# Patient Record
Sex: Female | Born: 1945 | Hispanic: No | Smoking: Never smoker
Health system: Southern US, Community
[De-identification: ages and names within clinical notes are randomized; demographics above are authoritative.]

## PROBLEM LIST (undated history)

## (undated) DIAGNOSIS — M1990 Unspecified osteoarthritis, unspecified site: Secondary | ICD-10-CM

## (undated) DIAGNOSIS — E785 Hyperlipidemia, unspecified: Secondary | ICD-10-CM

## (undated) DIAGNOSIS — M810 Age-related osteoporosis without current pathological fracture: Secondary | ICD-10-CM

## (undated) DIAGNOSIS — Z973 Presence of spectacles and contact lenses: Secondary | ICD-10-CM

## (undated) DIAGNOSIS — I10 Essential (primary) hypertension: Secondary | ICD-10-CM

## (undated) HISTORY — PX: COLONOSCOPY: SHX174

## (undated) HISTORY — PX: TUBAL LIGATION: SHX77

## (undated) HISTORY — PX: TONSILLECTOMY: SUR1361

---

## 2001-03-11 HISTORY — PX: SEPTOPLASTY: SUR1290

## 2005-12-20 ENCOUNTER — Other Ambulatory Visit: Admission: RE | Admit: 2005-12-20 | Discharge: 2005-12-20 | Payer: Self-pay | Admitting: Family Medicine

## 2012-05-29 ENCOUNTER — Encounter (HOSPITAL_BASED_OUTPATIENT_CLINIC_OR_DEPARTMENT_OTHER): Payer: Self-pay | Admitting: *Deleted

## 2012-05-29 ENCOUNTER — Other Ambulatory Visit: Payer: Self-pay | Admitting: Orthopedic Surgery

## 2012-05-29 NOTE — Progress Notes (Signed)
To come in for [id number]  

## 2012-06-01 LAB — BASIC METABOLIC PANEL
BUN: 18 mg/dL (ref 6–23)
Creatinine, Ser: 0.74 mg/dL (ref 0.50–1.10)
GFR calc Af Amer: >90 mL/min (ref >90)
GFR calc non Af Amer: 87 mL/min — ABNORMAL LOW (ref >90)
Glucose, Bld: 89 mg/dL (ref 70–99)

## 2012-06-02 NOTE — H&P (Signed)
Shelby Vega is an 67 y.o. female.   CC / Reason for Visit: Left wrist injury HPI: This patient is a 67 year old female who presents for evaluation of a left wrist injury that occurred when she tripped and fell at her home on the date above.  She was evaluated and ran out [facility] emergency department and placed into a sugar tong splint.  She presents today for further evaluation and management.  She is presently retired and does not work outside the home.  She is taking hydrocodone for pain and requires a refill.  Past Medical History  Diagnosis Date  . Osteoporosis   . Hypertension   . Hyperlipemia   . Wears glasses   . Arthritis     Past Surgical History  Procedure Laterality Date  . Tubal ligation    . Tonsillectomy    . Colonoscopy    . Septoplasty  2003    post fx nose    History reviewed. No pertinent family history. Social History:  reports that she has never smoked. She does not have any smokeless tobacco history on file. She reports that she does not drink alcohol or use illicit drugs.  Allergies:  Allergies  Allergen Reactions  . Codeine Nausea And Vomiting    No prescriptions prior to admission    Results for orders placed during the hospital encounter of 06/03/12 (from the past 48 hour(s))  BASIC METABOLIC PANEL     Status: Abnormal   Collection Time    06/01/12 12:30 PM      Result Value Range   Sodium 137  135 - 145 mEq/L   Potassium 4.2  3.5 - 5.1 mEq/L   Chloride 99  96 - 112 mEq/L   CO2 28  19 - 32 mEq/L   Glucose, Bld 89  70 - 99 mg/dL   BUN 18  6 - 23 mg/dL   Creatinine, Ser 1.61  0.50 - 1.10 mg/dL   Calcium 09.6  8.4 - 04.5 mg/dL   GFR calc non Af Amer 87 (*) >90 mL/min   GFR calc Af Amer >90  >90 mL/min   Comment:            The eGFR has been calculated     using the CKD EPI equation.     This calculation has not been     validated in all clinical     situations.     eGFR's persistently     <90 mL/min signify     possible Chronic Kidney  Disease.   No results found.  Review of Systems  All other systems reviewed and are negative.    Height 5\' 4"  (1.626 m), weight 72.576 kg (160 lb). Physical Exam  Constitutional:  WD, WN, NAD HEENT:  NCAT, EOMI Neuro/Psych:  Alert & oriented to person, place, and time; appropriate mood & affect Lymphatic: No generalized UE edema or lymphadenopathy Extremities / MSK:  Both UE are normal with respect to appearance, ranges of motion, joint stability, muscle strength/tone, sensation, & perfusion except as otherwise noted:  Left wrist has a nice fitting sugar tong splint applied.  It is slightly long in the palm and subsequently we trimmed it.  Intact light touch sensation in the radial, ulnar, and median nerve distributions with intact motor to the same.  Labs / Xrays:  4 views of the left wrist ordered and obtained today reveal an extra articular distal radius fracture with 22 dorsal tilt and a small slightly displaced fracture of  the tip of the ulnar styloid  Assessment: Displaced dorsally tilted left distal radius fracture  Plan:  I discussed these findings with the patient and used a plastic models.  I recommended open treatment to restore a more anatomic alignment and optimize functional recovery.   The details of the operative procedure were discussed with the patient.  Questions were invited and answered.  In addition to the goal of the procedure, the risks of the procedure to include but not limited to bleeding; infection; damage to the nerves or blood vessels that could result in bleeding, numbness, weakness, chronic pain, and the need for additional procedures; stiffness; the need for revision surgery; and anesthetic risks, the worst of which is death, were reviewed.  No specific outcome was guaranteed or implied.  Informed consent was obtained.  Prescriptions for postoperative analgesia were also written.  A return appointment 10-15 days postop was made.   Shelby Vega  A. 06/02/2012, 10:46 AM

## 2012-06-03 ENCOUNTER — Encounter (HOSPITAL_BASED_OUTPATIENT_CLINIC_OR_DEPARTMENT_OTHER)
Admission: RE | Disposition: A | Discharge status: Home / Self Care | Payer: Self-pay | Source: Ambulatory Visit | Attending: Orthopedic Surgery

## 2012-06-03 ENCOUNTER — Ambulatory Visit (HOSPITAL_BASED_OUTPATIENT_CLINIC_OR_DEPARTMENT_OTHER): Payer: Medicare Other | Admitting: Certified Registered Nurse Anesthetist

## 2012-06-03 ENCOUNTER — Ambulatory Visit (HOSPITAL_BASED_OUTPATIENT_CLINIC_OR_DEPARTMENT_OTHER)
Admission: RE | Admit: 2012-06-03 | Discharge: 2012-06-03 | Disposition: A | Discharge status: Home / Self Care | Payer: Medicare Other | Source: Ambulatory Visit | Attending: Orthopedic Surgery | Admitting: Orthopedic Surgery

## 2012-06-03 ENCOUNTER — Encounter (HOSPITAL_BASED_OUTPATIENT_CLINIC_OR_DEPARTMENT_OTHER): Payer: Self-pay

## 2012-06-03 ENCOUNTER — Ambulatory Visit (HOSPITAL_COMMUNITY): Payer: Medicare Other

## 2012-06-03 ENCOUNTER — Encounter (HOSPITAL_BASED_OUTPATIENT_CLINIC_OR_DEPARTMENT_OTHER): Payer: Self-pay | Admitting: Certified Registered Nurse Anesthetist

## 2012-06-03 DIAGNOSIS — Z885 Allergy status to narcotic agent status: Secondary | ICD-10-CM | POA: Insufficient documentation

## 2012-06-03 DIAGNOSIS — Y92009 Unspecified place in unspecified non-institutional (private) residence as the place of occurrence of the external cause: Secondary | ICD-10-CM | POA: Insufficient documentation

## 2012-06-03 DIAGNOSIS — S52509A Unspecified fracture of the lower end of unspecified radius, initial encounter for closed fracture: Secondary | ICD-10-CM | POA: Insufficient documentation

## 2012-06-03 DIAGNOSIS — S52609A Unspecified fracture of lower end of unspecified ulna, initial encounter for closed fracture: Secondary | ICD-10-CM | POA: Insufficient documentation

## 2012-06-03 DIAGNOSIS — I10 Essential (primary) hypertension: Secondary | ICD-10-CM | POA: Insufficient documentation

## 2012-06-03 DIAGNOSIS — M810 Age-related osteoporosis without current pathological fracture: Secondary | ICD-10-CM | POA: Insufficient documentation

## 2012-06-03 DIAGNOSIS — M129 Arthropathy, unspecified: Secondary | ICD-10-CM | POA: Insufficient documentation

## 2012-06-03 DIAGNOSIS — E785 Hyperlipidemia, unspecified: Secondary | ICD-10-CM | POA: Insufficient documentation

## 2012-06-03 DIAGNOSIS — W010XXA Fall on same level from slipping, tripping and stumbling without subsequent striking against object, initial encounter: Secondary | ICD-10-CM | POA: Insufficient documentation

## 2012-06-03 HISTORY — DX: Age-related osteoporosis without current pathological fracture: M81.0

## 2012-06-03 HISTORY — DX: Essential (primary) hypertension: I10

## 2012-06-03 HISTORY — PX: OPEN REDUCTION INTERNAL FIXATION (ORIF) DISTAL RADIAL FRACTURE: SHX5989

## 2012-06-03 HISTORY — DX: Presence of spectacles and contact lenses: Z97.3

## 2012-06-03 HISTORY — DX: Hyperlipidemia, unspecified: E78.5

## 2012-06-03 HISTORY — DX: Unspecified osteoarthritis, unspecified site: M19.90

## 2012-06-03 LAB — POCT HEMOGLOBIN-HEMACUE: Hemoglobin: 15.7 g/dL — ABNORMAL HIGH (ref 12.0–15.0)

## 2012-06-03 SURGERY — OPEN REDUCTION INTERNAL FIXATION (ORIF) DISTAL RADIUS FRACTURE
Anesthesia: Regional | Site: Wrist | Laterality: Left | Wound class: Clean

## 2012-06-03 MED ORDER — LIDOCAINE HCL (CARDIAC) 20 MG/ML IV SOLN
INTRAVENOUS | Status: DC | PRN
  Administered 2012-06-03: 30 mg via INTRAVENOUS

## 2012-06-03 MED ORDER — CEFAZOLIN SODIUM-DEXTROSE 2-3 GM-% IV SOLR
2.0000 g | INTRAVENOUS | Status: AC
  Administered 2012-06-03: 2 g via INTRAVENOUS

## 2012-06-03 MED ORDER — DEXAMETHASONE SODIUM PHOSPHATE 10 MG/ML IJ SOLN
INTRAMUSCULAR | Status: DC | PRN
  Administered 2012-06-03: 10 mg via INTRAVENOUS

## 2012-06-03 MED ORDER — 0.9 % SODIUM CHLORIDE (POUR BTL) OPTIME
TOPICAL | Status: DC | PRN
  Administered 2012-06-03: 1000 mL

## 2012-06-03 MED ORDER — ONDANSETRON HCL 4 MG/2ML IJ SOLN
INTRAMUSCULAR | Status: DC | PRN
  Administered 2012-06-03: 4 mg via INTRAVENOUS

## 2012-06-03 MED ORDER — MIDAZOLAM HCL 2 MG/2ML IJ SOLN
1.0000 mg | INTRAMUSCULAR | Status: DC | PRN
  Administered 2012-06-03 (×2): 1 mg via INTRAVENOUS

## 2012-06-03 MED ORDER — LIDOCAINE HCL 1 % IJ SOLN
INTRAMUSCULAR | Status: DC | PRN
  Administered 2012-06-03: 2 mL via INTRADERMAL

## 2012-06-03 MED ORDER — MIDAZOLAM HCL 5 MG/5ML IJ SOLN
INTRAMUSCULAR | Status: DC | PRN
  Administered 2012-06-03: 1 mg via INTRAVENOUS

## 2012-06-03 MED ORDER — FENTANYL CITRATE 0.05 MG/ML IJ SOLN
50.0000 ug | INTRAMUSCULAR | Status: DC | PRN
  Administered 2012-06-03 (×2): 50 ug via INTRAVENOUS

## 2012-06-03 MED ORDER — PROPOFOL 10 MG/ML IV BOLUS
INTRAVENOUS | Status: DC | PRN
  Administered 2012-06-03: 180 mg via INTRAVENOUS

## 2012-06-03 MED ORDER — LACTATED RINGERS IV SOLN: INTRAVENOUS | Status: DC

## 2012-06-03 MED ORDER — ROPIVACAINE HCL 5 MG/ML IJ SOLN
INTRAMUSCULAR | Status: DC | PRN
  Administered 2012-06-03: 20 mL

## 2012-06-03 MED ORDER — LACTATED RINGERS IV SOLN
INTRAVENOUS | Status: DC
  Administered 2012-06-03 (×2): via INTRAVENOUS

## 2012-06-03 SURGICAL SUPPLY — 58 items
BANDAGE COBAN STERILE 2 (GAUZE/BANDAGES/DRESSINGS) IMPLANT
BANDAGE GAUZE ELAST BULKY 4 IN (GAUZE/BANDAGES/DRESSINGS) ×4 IMPLANT
BIT DRILL 2 FAST STEP (BIT) ×1 IMPLANT
BIT DRILL 2.5X4 QC (BIT) ×1 IMPLANT
BLADE MINI RND TIP GREEN BEAV (BLADE) ×1 IMPLANT
BLADE SURG 15 STRL LF DISP TIS (BLADE) ×1 IMPLANT
BLADE SURG 15 STRL SS (BLADE) ×4
BNDG CMPR 9X4 STRL LF SNTH (GAUZE/BANDAGES/DRESSINGS) ×2
BNDG COHESIVE 4X5 TAN STRL (GAUZE/BANDAGES/DRESSINGS) ×2 IMPLANT
BNDG ESMARK 4X9 LF (GAUZE/BANDAGES/DRESSINGS) ×3 IMPLANT
BRUSH SCRUB EZ PLAIN DRY (MISCELLANEOUS) ×1 IMPLANT
CANISTER SUCTION 1200CC (MISCELLANEOUS) ×2 IMPLANT
CHLORAPREP W/TINT 26ML (MISCELLANEOUS) ×2 IMPLANT
CORDS BIPOLAR (ELECTRODE) ×2 IMPLANT
COVER MAYO STAND STRL (DRAPES) ×2 IMPLANT
COVER TABLE BACK 60X90 (DRAPES) ×2 IMPLANT
CUFF TOURNIQUET SINGLE 18IN (TOURNIQUET CUFF) ×1 IMPLANT
CUFF TOURNIQUET SINGLE 24IN (TOURNIQUET CUFF) IMPLANT
DRAPE C-ARM 42X72 X-RAY (DRAPES) ×2 IMPLANT
DRAPE EXTREMITY T 121X128X90 (DRAPE) ×2 IMPLANT
DRAPE SURG 17X23 STRL (DRAPES) ×2 IMPLANT
DRSG ADAPTIC 3X8 NADH LF (GAUZE/BANDAGES/DRESSINGS) ×2 IMPLANT
DRSG EMULSION OIL 3X3 NADH (GAUZE/BANDAGES/DRESSINGS) ×1 IMPLANT
GLOVE BIO SURGEON STRL SZ 6.5 (GLOVE) ×1 IMPLANT
GLOVE BIO SURGEON STRL SZ7.5 (GLOVE) ×2 IMPLANT
GLOVE BIOGEL PI IND STRL 8 (GLOVE) ×1 IMPLANT
GLOVE BIOGEL PI INDICATOR 8 (GLOVE) ×1
GLOVE ECLIPSE 6.5 STRL STRAW (GLOVE) ×1 IMPLANT
GOWN PREVENTION PLUS XLARGE (GOWN DISPOSABLE) ×3 IMPLANT
NDL HYPO 25X1 1.5 SAFETY (NEEDLE) IMPLANT
NEEDLE HYPO 25X1 1.5 SAFETY (NEEDLE) IMPLANT
NS IRRIG 1000ML POUR BTL (IV SOLUTION) ×2 IMPLANT
PACK BASIN DAY SURGERY FS (CUSTOM PROCEDURE TRAY) ×2 IMPLANT
PAD CAST 4YDX4 CTTN HI CHSV (CAST SUPPLIES) ×1 IMPLANT
PADDING CAST ABS 4INX4YD NS (CAST SUPPLIES) ×1
PADDING CAST ABS COTTON 4X4 ST (CAST SUPPLIES) IMPLANT
PADDING CAST COTTON 4X4 STRL (CAST SUPPLIES) ×2
PEG SUBCHONDRAL SMOOTH 2.0X18 (Peg) ×2 IMPLANT
PEG SUBCHONDRAL SMOOTH 2.0X20 (Peg) ×4 IMPLANT
PENCIL BUTTON HOLSTER BLD 10FT (ELECTRODE) ×2 IMPLANT
PLATE SHORT 21.6X48.9 NRRW LT (Plate) ×1 IMPLANT
RUBBERBAND STERILE (MISCELLANEOUS) IMPLANT
SCREW BN 12X3.5XNS CORT TI (Screw) IMPLANT
SCREW CORT 3.5X10 LNG (Screw) ×1 IMPLANT
SCREW CORT 3.5X12 (Screw) ×4 IMPLANT
SLEEVE SCD COMPRESS KNEE MED (MISCELLANEOUS) ×1 IMPLANT
SPLINT PLASTER CAST XFAST 3X15 (CAST SUPPLIES) IMPLANT
SPLINT PLASTER XTRA FASTSET 3X (CAST SUPPLIES) ×10
SPONGE GAUZE 4X4 12PLY (GAUZE/BANDAGES/DRESSINGS) ×1 IMPLANT
STOCKINETTE 4X48 STRL (DRAPES) ×2 IMPLANT
SUT VIC AB 2-0 CT3 27 (SUTURE) ×3 IMPLANT
SUT VICRYL 4-0 PS2 18IN ABS (SUTURE) IMPLANT
SUT VICRYL RAPIDE 4/0 PS 2 (SUTURE) ×2 IMPLANT
SYR BULB 3OZ (MISCELLANEOUS) ×2 IMPLANT
SYRINGE 10CC LL (SYRINGE) IMPLANT
TOWEL OR 17X24 6PK STRL BLUE (TOWEL DISPOSABLE) ×2 IMPLANT
TOWEL OR NON WOVEN STRL DISP B (DISPOSABLE) ×1 IMPLANT
UNDERPAD 30X30 INCONTINENT (UNDERPADS AND DIAPERS) ×2 IMPLANT

## 2012-06-03 NOTE — Interval H&P Note (Signed)
History and Physical Interval Note:  06/03/2012 11:27 AM  Shelby Vega  has presented today for surgery, with the diagnosis of let distal radius fracture   The various methods of treatment have been discussed with the patient and family. After consideration of risks, benefits and other options for treatment, the patient has consented to  Procedure(s): OPEN REDUCTION INTERNAL FIXATION (ORIF) LEFT DISTAL RADIUS (Left) as a surgical intervention .  The patient's history has been reviewed, patient examined, no change in status, stable for surgery.  I have reviewed the patient's chart and labs.  Questions were answered to the patient's satisfaction.     Zarinah Oviatt A.

## 2012-06-03 NOTE — Transfer of Care (Signed)
Immediate Anesthesia Transfer of Care Note  Patient: Shelby Vega  Procedure(s) Performed: Procedure(s): OPEN REDUCTION INTERNAL FIXATION (ORIF) LEFT DISTAL RADIUS (Left)  Patient Location: PACU  Anesthesia Type:GA combined with regional for post-op pain  Level of Consciousness: awake, alert , oriented and patient cooperative  Airway & Oxygen Therapy: Patient Spontanous Breathing and Patient connected to face mask oxygen  Post-op Assessment: Report given to PACU RN and Post -op Vital signs reviewed and stable  Post vital signs: Reviewed and stable  Complications: No apparent anesthesia complications

## 2012-06-03 NOTE — Progress Notes (Signed)
Assisted Dr. Frederick with left, ultrasound guided, supraclavicular block. Side rails up, monitors on throughout procedure. See vital signs in flow sheet. Tolerated Procedure well. 

## 2012-06-03 NOTE — Op Note (Signed)
06/03/2012  11:27 AM  PATIENT:  Shelby Vega  67 y.o. female  PRE-OPERATIVE DIAGNOSIS:  Displaced left distal radius fracture  POST-OPERATIVE DIAGNOSIS:  Same  PROCEDURE:  ORIF left distal radius fracture  SURGEON: Cliffton Asters. Janee Morn, MD  PHYSICIAN ASSISTANT: None  ANESTHESIA:  regional and general  SPECIMENS:  None  DRAINS:   None  PREOPERATIVE INDICATIONS:  [name] is a  67 y.o. female with a diagnosis of displaced left distal radius fracture with greater than 20 dorsal tilt who elected for surgical management.    The risks benefits and alternatives were discussed with the patient preoperatively including but not limited to the risks of infection, bleeding, nerve injury, cardiopulmonary complications, the need for revision surgery, among others, and the patient verbalized understanding and consented to proceed.  OPERATIVE IMPLANTS: Biomet DVR plate and screws  OPERATIVE FINDINGS: Near anatomic reduction following fixation  OPERATIVE PROCEDURE:  After receiving prophylactic antibiotics and a regional block, the patient was escorted to the operative theatre and placed in a supine position.  General anesthesia was administered. A surgical "time-out" was performed during which the planned procedure, proposed operative site, and the correct patient identity were compared to the operative consent and agreement confirmed by the circulating nurse according to current facility policy. The splint was removed, and the hand and arm were pre-scrubbed with a Hibiclens scrub brush.  Following application of a tourniquet to the operative extremity, the exposed skin was prepped with Chloraprep and draped in the usual sterile fashion.  The limb was exsanguinated with an Esmarch bandage and the tourniquet inflated to approximately higher than systolic BP.  A curvilinear incision over the FCR axis was made sharply with a scalpel. Subcutaneous tissues were dissected with blunt and spreading  dissection. The FCR was retracted radially and deeper dissection was exploited through the floor of the sheath. The contents of the canal were then retracted ulnarly and the pronator quadratus was split in an L. fashion and retracted ulnarly. The fracture was identified. He was provisionally reduced and confirmed under fluoroscopy. A standard Biomet DVR plate was secured to the shaft through the slotted hole so that adjustments in position could occur. With it appropriately adjusted, the fracture was reduced against the plate and the distal holes drilled and filled with smooth pegs. Fluoroscopy was used to ensure that the hardware was not intra-articular. The remainder of the proximal holes were then drilled and filled and final fluoroscopic images obtained. The wound is copiously irrigated and the pronator quadratus repaired with 2-0 Vicryl suture. Tourniquet was released, some additional hemostasis obtained, and the skin was closed with 2-0 Vicryl deep dermal buried sutures followed by running 4-0 Vicryl Rapide horizontal mattress suture. A bulky splint dressing was applied with a volar plaster component, and the patient was awakened and taken to the recovery room in stable condition, breathing spontaneously  DISPOSITION: The patient will be discharged home today with typical postop instructions, returning in 10-15 days for reevaluation, new x-rays of the left wrist 2 including high lateral out of the splint, and follow-on appointment with hand therapy to have a custom splint constructed and initiate rehabilitation.

## 2012-06-03 NOTE — Anesthesia Preprocedure Evaluation (Signed)
Anesthesia Evaluation  Patient identified by MRN, date of birth, ID band Patient awake    Reviewed: Allergy & Precautions, H&P , NPO status , Patient's Chart, lab work & pertinent test results, reviewed documented beta blocker date and time   Airway Mallampati: II TM Distance: >3 FB Neck ROM: full    Dental   Pulmonary neg pulmonary ROS,  breath sounds clear to auscultation        Cardiovascular hypertension, On Medications Rhythm:regular     Neuro/Psych negative neurological ROS  negative psych ROS   GI/Hepatic negative GI ROS, Neg liver ROS,   Endo/Other  negative endocrine ROS  Renal/GU negative Renal ROS  negative genitourinary   Musculoskeletal   Abdominal   Peds  Hematology negative hematology ROS (+)   Anesthesia Other Findings See surgeon's H&P   Reproductive/Obstetrics negative OB ROS                           Anesthesia Physical Anesthesia Plan  ASA: II  Anesthesia Plan: General   Post-op Pain Management:    Induction: Intravenous  Airway Management Planned: LMA  Additional Equipment:   Intra-op Plan:   Post-operative Plan: Extubation in OR  Informed Consent: I have reviewed the patients History and Physical, chart, labs and discussed the procedure including the risks, benefits and alternatives for the proposed anesthesia with the patient or authorized representative who has indicated his/her understanding and acceptance.   Dental Advisory Given  Plan Discussed with: CRNA and Surgeon  Anesthesia Plan Comments:         Anesthesia Quick Evaluation  

## 2012-06-03 NOTE — Anesthesia Postprocedure Evaluation (Signed)
Anesthesia Post Note  Patient: Shelby Vega  Procedure(s) Performed: Procedure(s) (LRB): OPEN REDUCTION INTERNAL FIXATION (ORIF) LEFT DISTAL RADIUS (Left)  Anesthesia type: General  Patient location: PACU  Post pain: Pain level controlled  Post assessment: Patient's Cardiovascular Status Stable  Last Vitals:  Filed Vitals:   06/03/12 1327  BP: 121/64  Pulse: 88  Temp: 37.1 C  Resp: 20    Post vital signs: Reviewed and stable  Level of consciousness: alert  Complications: No apparent anesthesia complications

## 2012-06-03 NOTE — Anesthesia Procedure Notes (Addendum)
Anesthesia Regional Block:  Supraclavicular block  Pre-Anesthetic Checklist: ,, timeout performed, Correct Patient, Correct Site, Correct Laterality, Correct Procedure, Correct Position, site marked, Risks and benefits discussed,  Surgical consent,  Pre-op evaluation,  At surgeon's request and post-op pain management  Laterality: Left  Prep: chloraprep       Needles:   Needle Type: Other     Needle Length: 9cm  Needle Gauge: 21    Additional Needles:  Procedures: ultrasound guided (picture in chart) Supraclavicular block Narrative:  Start time: 06/03/2012 10:09 AM End time: 06/03/2012 10:16 AM Injection made incrementally with aspirations every 5 mL.  Performed by: Personally  Anesthesiologist: C Frederick  Additional Notes: Ultrasound guidance used to: id relevant anatomy, confirm needle position, local anesthetic spread, avoidance of vascular puncture. Picture saved. No complications. Block performed personally by Janetta Hora. Gelene Mink, MD    Supraclavicular block Procedure Name: LMA Insertion Date/Time: 06/03/2012 11:48 AM Performed by: Caren Macadam Pre-anesthesia Checklist: Patient identified, Emergency Drugs available, Suction available and Patient being monitored Patient Re-evaluated:Patient Re-evaluated prior to inductionOxygen Delivery Method: Circle System Utilized Preoxygenation: Pre-oxygenation with 100% oxygen Intubation Type: IV induction Ventilation: Mask ventilation without difficulty LMA: LMA inserted LMA Size: 4.0 Number of attempts: 1 Airway Equipment and Method: bite block Placement Confirmation: positive ETCO2 Tube secured with: Tape Dental Injury: Teeth and Oropharynx as per pre-operative assessment

## 2012-06-04 ENCOUNTER — Encounter (HOSPITAL_BASED_OUTPATIENT_CLINIC_OR_DEPARTMENT_OTHER): Payer: Self-pay | Admitting: Orthopedic Surgery

## 2014-07-25 IMAGING — RF DG FOREARM 2V*L*
1 series · 3 of 3 positions shown · non-contrast
Comparison: None.

CLINICAL DATA: ORIF left radius fracture.

DG C-ARM 1-60 MIN,LEFT FOREARM - 2 VIEW

[Series 1: run · 3 of 3 slices shown]
[im 1/3]
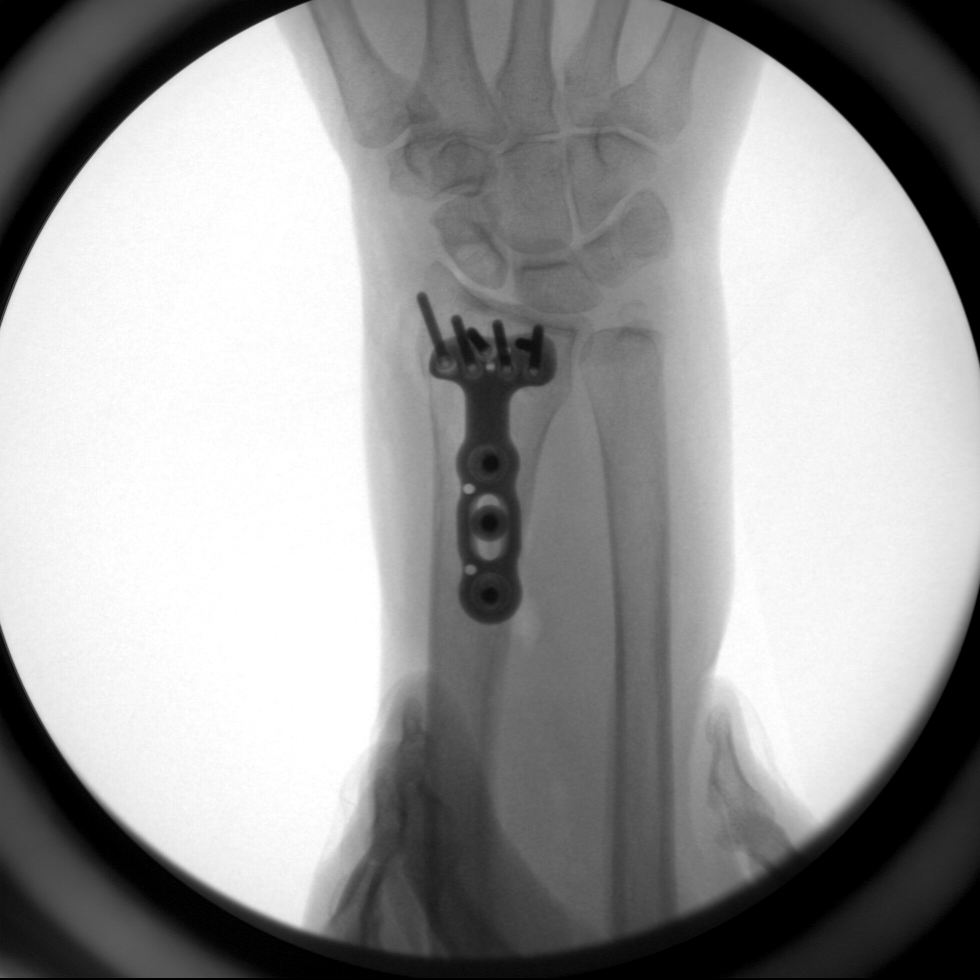
[im 2/3]
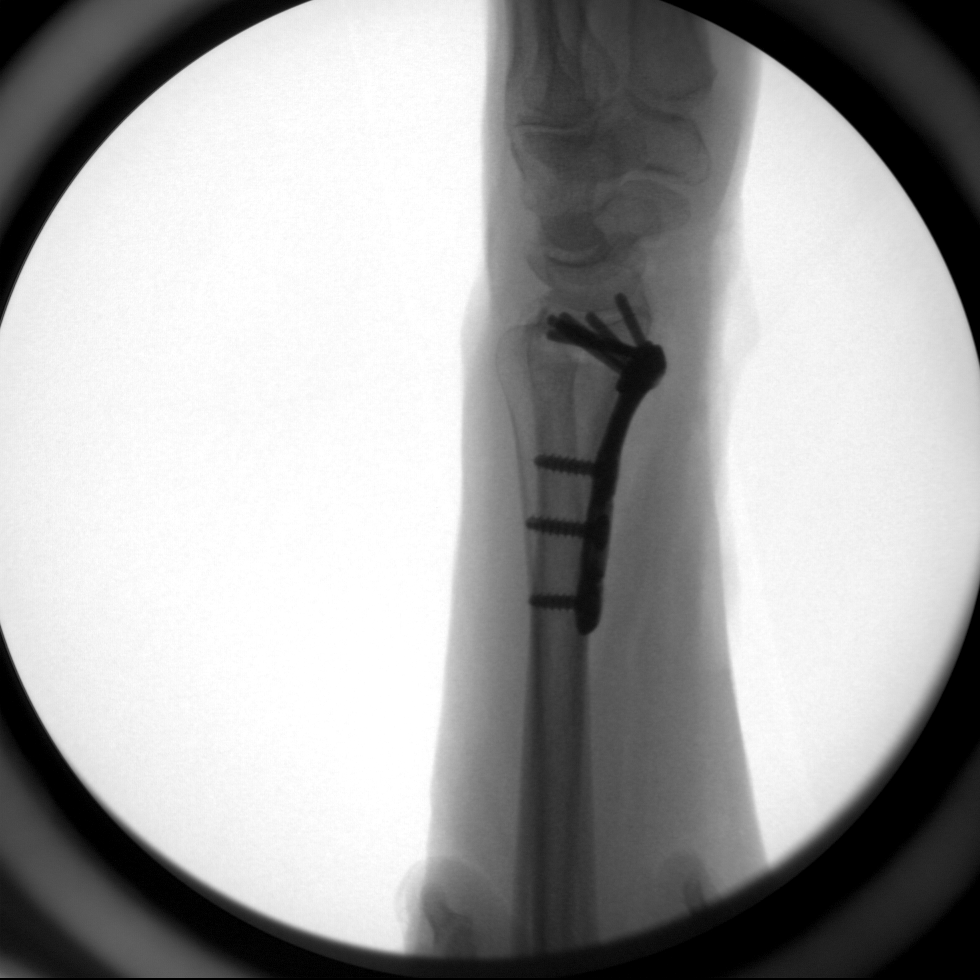
[im 3/3]
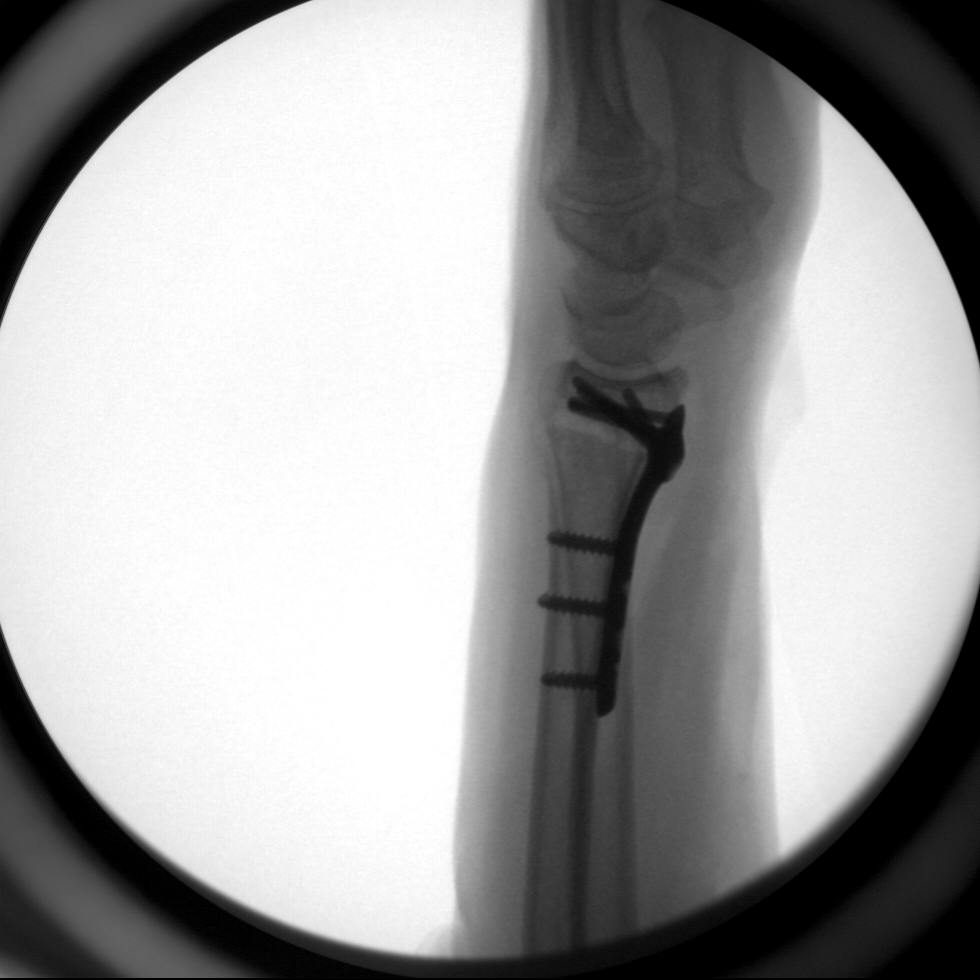

[3 of 3 positions shown; findings below may reference images not displayed]

FINDINGS: Three intraoperative images demonstrate interval side
plate and screw fixation of the previously seen distal radius
fracture.  Fracture fragments are in near anatomic alignment.
Ulnar styloid process fracture again noted.  No evidence for
hardware failure.
IMPRESSION: Expected intraoperative appearance after left radial fracture ORIF.

## 2015-04-10 DIAGNOSIS — Z1231 Encounter for screening mammogram for malignant neoplasm of breast: Secondary | ICD-10-CM | POA: Diagnosis not present

## 2015-04-24 DIAGNOSIS — Z1389 Encounter for screening for other disorder: Secondary | ICD-10-CM | POA: Diagnosis not present

## 2015-04-24 DIAGNOSIS — Z6830 Body mass index (BMI) 30.0-30.9, adult: Secondary | ICD-10-CM | POA: Diagnosis not present

## 2015-04-24 DIAGNOSIS — Z0000 Encounter for general adult medical examination without abnormal findings: Secondary | ICD-10-CM | POA: Diagnosis not present

## 2015-04-24 DIAGNOSIS — E785 Hyperlipidemia, unspecified: Secondary | ICD-10-CM | POA: Diagnosis not present

## 2015-04-24 DIAGNOSIS — M810 Age-related osteoporosis without current pathological fracture: Secondary | ICD-10-CM | POA: Diagnosis not present

## 2015-04-24 DIAGNOSIS — Z7289 Other problems related to lifestyle: Secondary | ICD-10-CM | POA: Diagnosis not present

## 2015-04-24 DIAGNOSIS — E2839 Other primary ovarian failure: Secondary | ICD-10-CM | POA: Diagnosis not present

## 2015-04-24 DIAGNOSIS — E669 Obesity, unspecified: Secondary | ICD-10-CM | POA: Diagnosis not present

## 2015-04-24 DIAGNOSIS — Z9181 History of falling: Secondary | ICD-10-CM | POA: Diagnosis not present

## 2015-04-24 DIAGNOSIS — I10 Essential (primary) hypertension: Secondary | ICD-10-CM | POA: Diagnosis not present

## 2015-04-26 DIAGNOSIS — R928 Other abnormal and inconclusive findings on diagnostic imaging of breast: Secondary | ICD-10-CM | POA: Diagnosis not present

## 2015-05-01 DIAGNOSIS — E2839 Other primary ovarian failure: Secondary | ICD-10-CM | POA: Diagnosis not present

## 2015-05-01 DIAGNOSIS — M8589 Other specified disorders of bone density and structure, multiple sites: Secondary | ICD-10-CM | POA: Diagnosis not present

## 2015-05-31 DIAGNOSIS — H524 Presbyopia: Secondary | ICD-10-CM | POA: Diagnosis not present

## 2015-05-31 DIAGNOSIS — H2513 Age-related nuclear cataract, bilateral: Secondary | ICD-10-CM | POA: Diagnosis not present

## 2015-06-05 DIAGNOSIS — H5213 Myopia, bilateral: Secondary | ICD-10-CM | POA: Diagnosis not present

## 2016-04-10 DIAGNOSIS — Z1231 Encounter for screening mammogram for malignant neoplasm of breast: Secondary | ICD-10-CM | POA: Diagnosis not present

## 2016-04-15 DIAGNOSIS — E010 Iodine-deficiency related diffuse (endemic) goiter: Secondary | ICD-10-CM | POA: Diagnosis not present

## 2016-04-15 DIAGNOSIS — Z6830 Body mass index (BMI) 30.0-30.9, adult: Secondary | ICD-10-CM | POA: Diagnosis not present

## 2016-04-15 DIAGNOSIS — Z79899 Other long term (current) drug therapy: Secondary | ICD-10-CM | POA: Diagnosis not present

## 2016-04-15 DIAGNOSIS — I10 Essential (primary) hypertension: Secondary | ICD-10-CM | POA: Diagnosis not present

## 2016-04-15 DIAGNOSIS — Z0000 Encounter for general adult medical examination without abnormal findings: Secondary | ICD-10-CM | POA: Diagnosis not present

## 2016-04-15 DIAGNOSIS — K5909 Other constipation: Secondary | ICD-10-CM | POA: Diagnosis not present

## 2016-04-15 DIAGNOSIS — E785 Hyperlipidemia, unspecified: Secondary | ICD-10-CM | POA: Diagnosis not present

## 2016-04-15 DIAGNOSIS — G4700 Insomnia, unspecified: Secondary | ICD-10-CM | POA: Diagnosis not present

## 2016-06-03 DIAGNOSIS — Z86010 Personal history of colonic polyps: Secondary | ICD-10-CM | POA: Diagnosis not present

## 2016-06-03 DIAGNOSIS — D122 Benign neoplasm of ascending colon: Secondary | ICD-10-CM | POA: Diagnosis not present

## 2016-06-03 DIAGNOSIS — D126 Benign neoplasm of colon, unspecified: Secondary | ICD-10-CM | POA: Diagnosis not present

## 2016-06-14 DIAGNOSIS — H524 Presbyopia: Secondary | ICD-10-CM | POA: Diagnosis not present

## 2016-06-14 DIAGNOSIS — H2513 Age-related nuclear cataract, bilateral: Secondary | ICD-10-CM | POA: Diagnosis not present

## 2016-10-16 DIAGNOSIS — G4700 Insomnia, unspecified: Secondary | ICD-10-CM | POA: Diagnosis not present

## 2016-10-16 DIAGNOSIS — E785 Hyperlipidemia, unspecified: Secondary | ICD-10-CM | POA: Diagnosis not present

## 2016-10-16 DIAGNOSIS — M810 Age-related osteoporosis without current pathological fracture: Secondary | ICD-10-CM | POA: Diagnosis not present

## 2016-10-16 DIAGNOSIS — Z6830 Body mass index (BMI) 30.0-30.9, adult: Secondary | ICD-10-CM | POA: Diagnosis not present

## 2016-10-16 DIAGNOSIS — I10 Essential (primary) hypertension: Secondary | ICD-10-CM | POA: Diagnosis not present

## 2016-10-16 DIAGNOSIS — K5909 Other constipation: Secondary | ICD-10-CM | POA: Diagnosis not present

## 2017-04-11 DIAGNOSIS — Z1231 Encounter for screening mammogram for malignant neoplasm of breast: Secondary | ICD-10-CM | POA: Diagnosis not present

## 2017-04-18 DIAGNOSIS — E785 Hyperlipidemia, unspecified: Secondary | ICD-10-CM | POA: Diagnosis not present

## 2017-04-18 DIAGNOSIS — Z1331 Encounter for screening for depression: Secondary | ICD-10-CM | POA: Diagnosis not present

## 2017-04-18 DIAGNOSIS — K5909 Other constipation: Secondary | ICD-10-CM | POA: Diagnosis not present

## 2017-04-18 DIAGNOSIS — Z136 Encounter for screening for cardiovascular disorders: Secondary | ICD-10-CM | POA: Diagnosis not present

## 2017-04-18 DIAGNOSIS — G4700 Insomnia, unspecified: Secondary | ICD-10-CM | POA: Diagnosis not present

## 2017-04-18 DIAGNOSIS — Z6831 Body mass index (BMI) 31.0-31.9, adult: Secondary | ICD-10-CM | POA: Diagnosis not present

## 2017-04-18 DIAGNOSIS — Z0000 Encounter for general adult medical examination without abnormal findings: Secondary | ICD-10-CM | POA: Diagnosis not present

## 2017-04-18 DIAGNOSIS — Z9181 History of falling: Secondary | ICD-10-CM | POA: Diagnosis not present

## 2017-04-18 DIAGNOSIS — I10 Essential (primary) hypertension: Secondary | ICD-10-CM | POA: Diagnosis not present

## 2017-04-18 DIAGNOSIS — E669 Obesity, unspecified: Secondary | ICD-10-CM | POA: Diagnosis not present

## 2017-06-16 DIAGNOSIS — H25012 Cortical age-related cataract, left eye: Secondary | ICD-10-CM | POA: Diagnosis not present

## 2017-06-16 DIAGNOSIS — H2511 Age-related nuclear cataract, right eye: Secondary | ICD-10-CM | POA: Diagnosis not present

## 2017-10-16 DIAGNOSIS — I10 Essential (primary) hypertension: Secondary | ICD-10-CM | POA: Diagnosis not present

## 2017-10-16 DIAGNOSIS — E785 Hyperlipidemia, unspecified: Secondary | ICD-10-CM | POA: Diagnosis not present

## 2017-10-16 DIAGNOSIS — K5909 Other constipation: Secondary | ICD-10-CM | POA: Diagnosis not present

## 2017-10-16 DIAGNOSIS — G4700 Insomnia, unspecified: Secondary | ICD-10-CM | POA: Diagnosis not present

## 2018-04-23 DIAGNOSIS — Z1331 Encounter for screening for depression: Secondary | ICD-10-CM | POA: Diagnosis not present

## 2018-04-23 DIAGNOSIS — Z9181 History of falling: Secondary | ICD-10-CM | POA: Diagnosis not present

## 2018-04-23 DIAGNOSIS — G4700 Insomnia, unspecified: Secondary | ICD-10-CM | POA: Diagnosis not present

## 2018-04-23 DIAGNOSIS — E785 Hyperlipidemia, unspecified: Secondary | ICD-10-CM | POA: Diagnosis not present

## 2018-04-23 DIAGNOSIS — K5909 Other constipation: Secondary | ICD-10-CM | POA: Diagnosis not present

## 2018-04-23 DIAGNOSIS — I10 Essential (primary) hypertension: Secondary | ICD-10-CM | POA: Diagnosis not present

## 2018-04-24 DIAGNOSIS — Z1231 Encounter for screening mammogram for malignant neoplasm of breast: Secondary | ICD-10-CM | POA: Diagnosis not present

## 2018-07-20 DIAGNOSIS — Z9181 History of falling: Secondary | ICD-10-CM | POA: Diagnosis not present

## 2018-07-20 DIAGNOSIS — E785 Hyperlipidemia, unspecified: Secondary | ICD-10-CM | POA: Diagnosis not present

## 2018-07-20 DIAGNOSIS — Z0000 Encounter for general adult medical examination without abnormal findings: Secondary | ICD-10-CM | POA: Diagnosis not present

## 2018-07-20 DIAGNOSIS — Z6831 Body mass index (BMI) 31.0-31.9, adult: Secondary | ICD-10-CM | POA: Diagnosis not present

## 2018-07-20 DIAGNOSIS — Z1331 Encounter for screening for depression: Secondary | ICD-10-CM | POA: Diagnosis not present

## 2018-08-26 DIAGNOSIS — E2839 Other primary ovarian failure: Secondary | ICD-10-CM | POA: Diagnosis not present

## 2018-08-26 DIAGNOSIS — M810 Age-related osteoporosis without current pathological fracture: Secondary | ICD-10-CM | POA: Diagnosis not present

## 2018-10-02 DIAGNOSIS — H5203 Hypermetropia, bilateral: Secondary | ICD-10-CM | POA: Diagnosis not present

## 2018-10-02 DIAGNOSIS — H25813 Combined forms of age-related cataract, bilateral: Secondary | ICD-10-CM | POA: Diagnosis not present

## 2018-10-26 DIAGNOSIS — I10 Essential (primary) hypertension: Secondary | ICD-10-CM | POA: Diagnosis not present

## 2018-10-26 DIAGNOSIS — K5909 Other constipation: Secondary | ICD-10-CM | POA: Diagnosis not present

## 2018-10-26 DIAGNOSIS — E785 Hyperlipidemia, unspecified: Secondary | ICD-10-CM | POA: Diagnosis not present

## 2018-10-26 DIAGNOSIS — Z139 Encounter for screening, unspecified: Secondary | ICD-10-CM | POA: Diagnosis not present

## 2018-10-26 DIAGNOSIS — G4700 Insomnia, unspecified: Secondary | ICD-10-CM | POA: Diagnosis not present

## 2019-04-27 DIAGNOSIS — Z1231 Encounter for screening mammogram for malignant neoplasm of breast: Secondary | ICD-10-CM | POA: Diagnosis not present

## 2019-04-28 DIAGNOSIS — M8580 Other specified disorders of bone density and structure, unspecified site: Secondary | ICD-10-CM | POA: Diagnosis not present

## 2019-04-28 DIAGNOSIS — G4700 Insomnia, unspecified: Secondary | ICD-10-CM | POA: Diagnosis not present

## 2019-04-28 DIAGNOSIS — I10 Essential (primary) hypertension: Secondary | ICD-10-CM | POA: Diagnosis not present

## 2019-04-28 DIAGNOSIS — E785 Hyperlipidemia, unspecified: Secondary | ICD-10-CM | POA: Diagnosis not present

## 2019-07-22 DIAGNOSIS — Z6831 Body mass index (BMI) 31.0-31.9, adult: Secondary | ICD-10-CM | POA: Diagnosis not present

## 2019-07-22 DIAGNOSIS — E785 Hyperlipidemia, unspecified: Secondary | ICD-10-CM | POA: Diagnosis not present

## 2019-07-22 DIAGNOSIS — Z9181 History of falling: Secondary | ICD-10-CM | POA: Diagnosis not present

## 2019-07-22 DIAGNOSIS — Z1331 Encounter for screening for depression: Secondary | ICD-10-CM | POA: Diagnosis not present

## 2019-07-22 DIAGNOSIS — Z0000 Encounter for general adult medical examination without abnormal findings: Secondary | ICD-10-CM | POA: Diagnosis not present

## 2019-07-22 DIAGNOSIS — N959 Unspecified menopausal and perimenopausal disorder: Secondary | ICD-10-CM | POA: Diagnosis not present

## 2019-10-06 DIAGNOSIS — H524 Presbyopia: Secondary | ICD-10-CM | POA: Diagnosis not present

## 2019-10-06 DIAGNOSIS — H25813 Combined forms of age-related cataract, bilateral: Secondary | ICD-10-CM | POA: Diagnosis not present

## 2019-10-26 DIAGNOSIS — Z6830 Body mass index (BMI) 30.0-30.9, adult: Secondary | ICD-10-CM | POA: Diagnosis not present

## 2019-10-26 DIAGNOSIS — I10 Essential (primary) hypertension: Secondary | ICD-10-CM | POA: Diagnosis not present

## 2019-10-26 DIAGNOSIS — G4700 Insomnia, unspecified: Secondary | ICD-10-CM | POA: Diagnosis not present

## 2019-10-26 DIAGNOSIS — E559 Vitamin D deficiency, unspecified: Secondary | ICD-10-CM | POA: Diagnosis not present

## 2019-10-26 DIAGNOSIS — E785 Hyperlipidemia, unspecified: Secondary | ICD-10-CM | POA: Diagnosis not present

## 2019-10-26 DIAGNOSIS — M8580 Other specified disorders of bone density and structure, unspecified site: Secondary | ICD-10-CM | POA: Diagnosis not present

## 2019-10-26 DIAGNOSIS — K5909 Other constipation: Secondary | ICD-10-CM | POA: Diagnosis not present

## 2020-01-03 DIAGNOSIS — Z23 Encounter for immunization: Secondary | ICD-10-CM | POA: Diagnosis not present

## 2020-01-19 DIAGNOSIS — Z23 Encounter for immunization: Secondary | ICD-10-CM | POA: Diagnosis not present

## 2020-02-10 DIAGNOSIS — L259 Unspecified contact dermatitis, unspecified cause: Secondary | ICD-10-CM | POA: Diagnosis not present

## 2020-02-10 DIAGNOSIS — K644 Residual hemorrhoidal skin tags: Secondary | ICD-10-CM | POA: Diagnosis not present

## 2020-02-10 DIAGNOSIS — Z6831 Body mass index (BMI) 31.0-31.9, adult: Secondary | ICD-10-CM | POA: Diagnosis not present

## 2020-03-01 DIAGNOSIS — Z6831 Body mass index (BMI) 31.0-31.9, adult: Secondary | ICD-10-CM | POA: Diagnosis not present

## 2020-03-01 DIAGNOSIS — R238 Other skin changes: Secondary | ICD-10-CM | POA: Diagnosis not present

## 2020-05-01 DIAGNOSIS — Z6831 Body mass index (BMI) 31.0-31.9, adult: Secondary | ICD-10-CM | POA: Diagnosis not present

## 2020-05-01 DIAGNOSIS — G4700 Insomnia, unspecified: Secondary | ICD-10-CM | POA: Diagnosis not present

## 2020-05-01 DIAGNOSIS — I10 Essential (primary) hypertension: Secondary | ICD-10-CM | POA: Diagnosis not present

## 2020-05-01 DIAGNOSIS — E785 Hyperlipidemia, unspecified: Secondary | ICD-10-CM | POA: Diagnosis not present

## 2020-05-01 DIAGNOSIS — K5909 Other constipation: Secondary | ICD-10-CM | POA: Diagnosis not present

## 2020-05-01 DIAGNOSIS — M8580 Other specified disorders of bone density and structure, unspecified site: Secondary | ICD-10-CM | POA: Diagnosis not present

## 2020-05-02 DIAGNOSIS — Z1231 Encounter for screening mammogram for malignant neoplasm of breast: Secondary | ICD-10-CM | POA: Diagnosis not present

## 2020-07-24 DIAGNOSIS — Z9181 History of falling: Secondary | ICD-10-CM | POA: Diagnosis not present

## 2020-07-24 DIAGNOSIS — Z1331 Encounter for screening for depression: Secondary | ICD-10-CM | POA: Diagnosis not present

## 2020-07-24 DIAGNOSIS — E785 Hyperlipidemia, unspecified: Secondary | ICD-10-CM | POA: Diagnosis not present

## 2020-07-24 DIAGNOSIS — Z0000 Encounter for general adult medical examination without abnormal findings: Secondary | ICD-10-CM | POA: Diagnosis not present

## 2020-07-24 DIAGNOSIS — Z139 Encounter for screening, unspecified: Secondary | ICD-10-CM | POA: Diagnosis not present

## 2020-07-24 DIAGNOSIS — E669 Obesity, unspecified: Secondary | ICD-10-CM | POA: Diagnosis not present

## 2020-10-09 DIAGNOSIS — H524 Presbyopia: Secondary | ICD-10-CM | POA: Diagnosis not present

## 2020-10-09 DIAGNOSIS — H40013 Open angle with borderline findings, low risk, bilateral: Secondary | ICD-10-CM | POA: Diagnosis not present

## 2020-10-10 DIAGNOSIS — Z20822 Contact with and (suspected) exposure to covid-19: Secondary | ICD-10-CM | POA: Diagnosis not present

## 2020-10-30 DIAGNOSIS — M810 Age-related osteoporosis without current pathological fracture: Secondary | ICD-10-CM | POA: Diagnosis not present

## 2020-10-30 DIAGNOSIS — K5909 Other constipation: Secondary | ICD-10-CM | POA: Diagnosis not present

## 2020-10-30 DIAGNOSIS — I10 Essential (primary) hypertension: Secondary | ICD-10-CM | POA: Diagnosis not present

## 2020-10-30 DIAGNOSIS — E785 Hyperlipidemia, unspecified: Secondary | ICD-10-CM | POA: Diagnosis not present

## 2020-10-30 DIAGNOSIS — G4700 Insomnia, unspecified: Secondary | ICD-10-CM | POA: Diagnosis not present

## 2020-10-30 DIAGNOSIS — R000 Tachycardia, unspecified: Secondary | ICD-10-CM | POA: Diagnosis not present

## 2020-10-30 DIAGNOSIS — Z6831 Body mass index (BMI) 31.0-31.9, adult: Secondary | ICD-10-CM | POA: Diagnosis not present

## 2020-10-30 DIAGNOSIS — Z79899 Other long term (current) drug therapy: Secondary | ICD-10-CM | POA: Diagnosis not present

## 2021-05-02 DIAGNOSIS — B009 Herpesviral infection, unspecified: Secondary | ICD-10-CM | POA: Diagnosis not present

## 2021-05-02 DIAGNOSIS — Z79899 Other long term (current) drug therapy: Secondary | ICD-10-CM | POA: Diagnosis not present

## 2021-05-02 DIAGNOSIS — Z6831 Body mass index (BMI) 31.0-31.9, adult: Secondary | ICD-10-CM | POA: Diagnosis not present

## 2021-05-02 DIAGNOSIS — R000 Tachycardia, unspecified: Secondary | ICD-10-CM | POA: Diagnosis not present

## 2021-05-02 DIAGNOSIS — G4700 Insomnia, unspecified: Secondary | ICD-10-CM | POA: Diagnosis not present

## 2021-05-02 DIAGNOSIS — E785 Hyperlipidemia, unspecified: Secondary | ICD-10-CM | POA: Diagnosis not present

## 2021-05-02 DIAGNOSIS — I10 Essential (primary) hypertension: Secondary | ICD-10-CM | POA: Diagnosis not present

## 2021-05-02 DIAGNOSIS — J302 Other seasonal allergic rhinitis: Secondary | ICD-10-CM | POA: Diagnosis not present

## 2021-05-02 DIAGNOSIS — M810 Age-related osteoporosis without current pathological fracture: Secondary | ICD-10-CM | POA: Diagnosis not present

## 2021-05-04 DIAGNOSIS — Z1231 Encounter for screening mammogram for malignant neoplasm of breast: Secondary | ICD-10-CM | POA: Diagnosis not present

## 2021-05-09 ENCOUNTER — Telehealth: Payer: Self-pay

## 2021-05-09 DIAGNOSIS — R000 Tachycardia, unspecified: Secondary | ICD-10-CM

## 2021-05-09 NOTE — Telephone Encounter (Signed)
Received fax from patient's PCP wanting a 30 day event monitor placed on patient for intermittent tachycardia.  ?

## 2021-05-17 ENCOUNTER — Telehealth: Payer: Self-pay | Admitting: *Deleted

## 2021-05-17 DIAGNOSIS — R000 Tachycardia, unspecified: Secondary | ICD-10-CM

## 2021-05-17 DIAGNOSIS — I351 Nonrheumatic aortic (valve) insufficiency: Secondary | ICD-10-CM

## 2021-05-17 NOTE — Telephone Encounter (Signed)
Entered in error

## 2021-05-18 ENCOUNTER — Telehealth: Payer: Self-pay | Admitting: *Deleted

## 2021-05-18 NOTE — Telephone Encounter (Signed)
Spoke with pt and let her know the 30 Day Event Monitor would be sent to her house for her to put on. She was happy to know she didn't have to come in and said if she needed help she would go to her PCP. Registered her today to receive monitor in mail.  ?

## 2021-05-30 DIAGNOSIS — Z20822 Contact with and (suspected) exposure to covid-19: Secondary | ICD-10-CM | POA: Diagnosis not present

## 2021-05-31 ENCOUNTER — Telehealth: Payer: Self-pay | Admitting: *Deleted

## 2021-05-31 NOTE — Telephone Encounter (Signed)
Spoke with pt about 30 day event monitor that had been sent to her house. She stated she sent it back the same day she got it and that she would rather see a cardiologist. I let her know a cardiologist would ask her to wear the monitor as well and she said she would cross that bridge when she came to it.  ?

## 2021-06-21 DIAGNOSIS — Z20822 Contact with and (suspected) exposure to covid-19: Secondary | ICD-10-CM | POA: Diagnosis not present

## 2021-07-09 DIAGNOSIS — Z20822 Contact with and (suspected) exposure to covid-19: Secondary | ICD-10-CM | POA: Diagnosis not present

## 2021-07-13 DIAGNOSIS — Z20828 Contact with and (suspected) exposure to other viral communicable diseases: Secondary | ICD-10-CM | POA: Diagnosis not present

## 2021-08-02 DIAGNOSIS — Z9181 History of falling: Secondary | ICD-10-CM | POA: Diagnosis not present

## 2021-08-02 DIAGNOSIS — Z6831 Body mass index (BMI) 31.0-31.9, adult: Secondary | ICD-10-CM | POA: Diagnosis not present

## 2021-08-02 DIAGNOSIS — E785 Hyperlipidemia, unspecified: Secondary | ICD-10-CM | POA: Diagnosis not present

## 2021-08-02 DIAGNOSIS — Z139 Encounter for screening, unspecified: Secondary | ICD-10-CM | POA: Diagnosis not present

## 2021-08-02 DIAGNOSIS — Z0000 Encounter for general adult medical examination without abnormal findings: Secondary | ICD-10-CM | POA: Diagnosis not present

## 2021-08-02 DIAGNOSIS — Z1331 Encounter for screening for depression: Secondary | ICD-10-CM | POA: Diagnosis not present

## 2021-08-02 DIAGNOSIS — E669 Obesity, unspecified: Secondary | ICD-10-CM | POA: Diagnosis not present

## 2021-10-15 DIAGNOSIS — H5203 Hypermetropia, bilateral: Secondary | ICD-10-CM | POA: Diagnosis not present

## 2021-10-15 DIAGNOSIS — H40013 Open angle with borderline findings, low risk, bilateral: Secondary | ICD-10-CM | POA: Diagnosis not present

## 2021-10-31 DIAGNOSIS — I10 Essential (primary) hypertension: Secondary | ICD-10-CM | POA: Diagnosis not present

## 2021-10-31 DIAGNOSIS — B009 Herpesviral infection, unspecified: Secondary | ICD-10-CM | POA: Diagnosis not present

## 2021-10-31 DIAGNOSIS — G4700 Insomnia, unspecified: Secondary | ICD-10-CM | POA: Diagnosis not present

## 2021-10-31 DIAGNOSIS — R21 Rash and other nonspecific skin eruption: Secondary | ICD-10-CM | POA: Diagnosis not present

## 2021-10-31 DIAGNOSIS — E785 Hyperlipidemia, unspecified: Secondary | ICD-10-CM | POA: Diagnosis not present

## 2021-10-31 DIAGNOSIS — Z6830 Body mass index (BMI) 30.0-30.9, adult: Secondary | ICD-10-CM | POA: Diagnosis not present

## 2021-10-31 DIAGNOSIS — J302 Other seasonal allergic rhinitis: Secondary | ICD-10-CM | POA: Diagnosis not present

## 2021-10-31 DIAGNOSIS — M810 Age-related osteoporosis without current pathological fracture: Secondary | ICD-10-CM | POA: Diagnosis not present

## 2021-10-31 DIAGNOSIS — Z79899 Other long term (current) drug therapy: Secondary | ICD-10-CM | POA: Diagnosis not present

## 2022-05-03 DIAGNOSIS — I10 Essential (primary) hypertension: Secondary | ICD-10-CM | POA: Diagnosis not present

## 2022-05-03 DIAGNOSIS — Z23 Encounter for immunization: Secondary | ICD-10-CM | POA: Diagnosis not present

## 2022-05-03 DIAGNOSIS — Z79899 Other long term (current) drug therapy: Secondary | ICD-10-CM | POA: Diagnosis not present

## 2022-05-03 DIAGNOSIS — E785 Hyperlipidemia, unspecified: Secondary | ICD-10-CM | POA: Diagnosis not present

## 2022-05-03 DIAGNOSIS — M810 Age-related osteoporosis without current pathological fracture: Secondary | ICD-10-CM | POA: Diagnosis not present

## 2022-05-03 DIAGNOSIS — G4700 Insomnia, unspecified: Secondary | ICD-10-CM | POA: Diagnosis not present

## 2022-05-03 DIAGNOSIS — B009 Herpesviral infection, unspecified: Secondary | ICD-10-CM | POA: Diagnosis not present

## 2022-05-03 DIAGNOSIS — Z6832 Body mass index (BMI) 32.0-32.9, adult: Secondary | ICD-10-CM | POA: Diagnosis not present

## 2022-05-03 DIAGNOSIS — J302 Other seasonal allergic rhinitis: Secondary | ICD-10-CM | POA: Diagnosis not present

## 2022-05-10 DIAGNOSIS — Z1231 Encounter for screening mammogram for malignant neoplasm of breast: Secondary | ICD-10-CM | POA: Diagnosis not present

## 2022-05-10 DIAGNOSIS — M810 Age-related osteoporosis without current pathological fracture: Secondary | ICD-10-CM | POA: Diagnosis not present

## 2022-11-05 DIAGNOSIS — Z79899 Other long term (current) drug therapy: Secondary | ICD-10-CM | POA: Diagnosis not present

## 2022-11-05 DIAGNOSIS — J302 Other seasonal allergic rhinitis: Secondary | ICD-10-CM | POA: Diagnosis not present

## 2022-11-05 DIAGNOSIS — G4700 Insomnia, unspecified: Secondary | ICD-10-CM | POA: Diagnosis not present

## 2022-11-05 DIAGNOSIS — Z1331 Encounter for screening for depression: Secondary | ICD-10-CM | POA: Diagnosis not present

## 2022-11-05 DIAGNOSIS — Z9181 History of falling: Secondary | ICD-10-CM | POA: Diagnosis not present

## 2022-11-05 DIAGNOSIS — M810 Age-related osteoporosis without current pathological fracture: Secondary | ICD-10-CM | POA: Diagnosis not present

## 2022-11-05 DIAGNOSIS — I10 Essential (primary) hypertension: Secondary | ICD-10-CM | POA: Diagnosis not present

## 2022-11-05 DIAGNOSIS — E782 Mixed hyperlipidemia: Secondary | ICD-10-CM | POA: Diagnosis not present

## 2022-11-05 DIAGNOSIS — B009 Herpesviral infection, unspecified: Secondary | ICD-10-CM | POA: Diagnosis not present

## 2023-05-07 DIAGNOSIS — H524 Presbyopia: Secondary | ICD-10-CM | POA: Diagnosis not present

## 2023-05-07 DIAGNOSIS — H40013 Open angle with borderline findings, low risk, bilateral: Secondary | ICD-10-CM | POA: Diagnosis not present

## 2023-05-08 DIAGNOSIS — Z139 Encounter for screening, unspecified: Secondary | ICD-10-CM | POA: Diagnosis not present

## 2023-05-08 DIAGNOSIS — J302 Other seasonal allergic rhinitis: Secondary | ICD-10-CM | POA: Diagnosis not present

## 2023-05-08 DIAGNOSIS — I10 Essential (primary) hypertension: Secondary | ICD-10-CM | POA: Diagnosis not present

## 2023-05-08 DIAGNOSIS — E782 Mixed hyperlipidemia: Secondary | ICD-10-CM | POA: Diagnosis not present

## 2023-05-08 DIAGNOSIS — G4700 Insomnia, unspecified: Secondary | ICD-10-CM | POA: Diagnosis not present

## 2023-05-08 DIAGNOSIS — Z1231 Encounter for screening mammogram for malignant neoplasm of breast: Secondary | ICD-10-CM | POA: Diagnosis not present

## 2023-05-08 DIAGNOSIS — Z79899 Other long term (current) drug therapy: Secondary | ICD-10-CM | POA: Diagnosis not present

## 2023-05-08 DIAGNOSIS — M810 Age-related osteoporosis without current pathological fracture: Secondary | ICD-10-CM | POA: Diagnosis not present

## 2023-05-08 DIAGNOSIS — B009 Herpesviral infection, unspecified: Secondary | ICD-10-CM | POA: Diagnosis not present

## 2023-05-12 DIAGNOSIS — Z1231 Encounter for screening mammogram for malignant neoplasm of breast: Secondary | ICD-10-CM | POA: Diagnosis not present

## 2023-05-13 DIAGNOSIS — Z1231 Encounter for screening mammogram for malignant neoplasm of breast: Secondary | ICD-10-CM | POA: Diagnosis not present

## 2023-05-13 DIAGNOSIS — Z803 Family history of malignant neoplasm of breast: Secondary | ICD-10-CM | POA: Diagnosis not present

## 2023-11-11 DIAGNOSIS — Z1331 Encounter for screening for depression: Secondary | ICD-10-CM | POA: Diagnosis not present

## 2023-11-11 DIAGNOSIS — I10 Essential (primary) hypertension: Secondary | ICD-10-CM | POA: Diagnosis not present

## 2023-11-11 DIAGNOSIS — B009 Herpesviral infection, unspecified: Secondary | ICD-10-CM | POA: Diagnosis not present

## 2023-11-11 DIAGNOSIS — J302 Other seasonal allergic rhinitis: Secondary | ICD-10-CM | POA: Diagnosis not present

## 2023-11-11 DIAGNOSIS — G4700 Insomnia, unspecified: Secondary | ICD-10-CM | POA: Diagnosis not present

## 2023-11-11 DIAGNOSIS — M810 Age-related osteoporosis without current pathological fracture: Secondary | ICD-10-CM | POA: Diagnosis not present

## 2023-11-11 DIAGNOSIS — Z79899 Other long term (current) drug therapy: Secondary | ICD-10-CM | POA: Diagnosis not present

## 2023-11-11 DIAGNOSIS — Z9181 History of falling: Secondary | ICD-10-CM | POA: Diagnosis not present

## 2023-11-11 DIAGNOSIS — E782 Mixed hyperlipidemia: Secondary | ICD-10-CM | POA: Diagnosis not present
